# Patient Record
Sex: Female | Born: 1970 | Race: Black or African American | Hispanic: Yes | Marital: Married | Smoking: Never smoker
Health system: Southern US, Community
[De-identification: ages and names within clinical notes are randomized; demographics above are authoritative.]

## PROBLEM LIST (undated history)

## (undated) HISTORY — PX: NO PAST SURGERIES: SHX2092

---

## 2016-01-19 ENCOUNTER — Encounter (HOSPITAL_COMMUNITY): Payer: Self-pay

## 2016-01-19 ENCOUNTER — Emergency Department (HOSPITAL_COMMUNITY)
Admission: EM | Admit: 2016-01-19 | Discharge: 2016-01-20 | Disposition: A | Discharge status: Home / Self Care | Payer: No Typology Code available for payment source | Attending: Emergency Medicine | Admitting: Emergency Medicine

## 2016-01-19 ENCOUNTER — Emergency Department (HOSPITAL_COMMUNITY): Payer: No Typology Code available for payment source

## 2016-01-19 DIAGNOSIS — S161XXA Strain of muscle, fascia and tendon at neck level, initial encounter: Secondary | ICD-10-CM

## 2016-01-19 DIAGNOSIS — S199XXA Unspecified injury of neck, initial encounter: Secondary | ICD-10-CM | POA: Diagnosis present

## 2016-01-19 DIAGNOSIS — Y999 Unspecified external cause status: Secondary | ICD-10-CM | POA: Insufficient documentation

## 2016-01-19 DIAGNOSIS — Y9389 Activity, other specified: Secondary | ICD-10-CM | POA: Insufficient documentation

## 2016-01-19 DIAGNOSIS — Y92410 Unspecified street and highway as the place of occurrence of the external cause: Secondary | ICD-10-CM | POA: Insufficient documentation

## 2016-01-19 MED ORDER — IBUPROFEN 200 MG PO TABS
400.0000 mg | ORAL_TABLET | Freq: Once | ORAL | Status: AC
  Administered 2016-01-19: 400 mg via ORAL
  Filled 2016-01-19: qty 2

## 2016-01-19 MED ORDER — METHOCARBAMOL 500 MG PO TABS
1000.0000 mg | ORAL_TABLET | Freq: Once | ORAL | Status: AC
  Administered 2016-01-19: 1000 mg via ORAL
  Filled 2016-01-19: qty 2
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
## 2016-01-20 MED ORDER — METHOCARBAMOL 500 MG PO TABS: 1000.0000 mg | ORAL_TABLET | Freq: Four times a day (QID) | ORAL | 0 refills | Status: AC | PRN

## 2016-01-20 NOTE — Discharge Instructions (Signed)
For pain control you may take up to 800mg of Motrin (also known as ibuprofen). That is usually 4 over the counter pills,  3 times a day. Take with food to minimize stomach irritation  ° °You can also take  tylenol (acetaminophen) 975mg (this is 3 over the counter pills) four times a day. Do not drink alcohol or combine with other medications that have acetaminophen as an ingredient (Read the labels!).   ° °For breakthrough pain you may take Robaxin. Do not drink alcohol, drive or operate heavy machinery when taking Robaxin. ° °Do not hesitate to return to the emergency room for any new, worsening or concerning symptoms. ° °Please obtain primary care using resource guide below. Let them know that you were seen in the emergency room and that they will need to obtain records for further outpatient management. ° ° °
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
## 2020-03-28 ENCOUNTER — Encounter: Payer: Self-pay | Admitting: Internal Medicine

## 2020-03-28 ENCOUNTER — Ambulatory Visit: Payer: Self-pay | Attending: Internal Medicine | Admitting: Internal Medicine

## 2020-03-28 ENCOUNTER — Other Ambulatory Visit: Payer: Self-pay

## 2020-03-28 VITALS — BP 160/90 | HR 73 | Resp 16 | Ht 62.0 in | Wt 172.6 lb

## 2020-03-28 DIAGNOSIS — Z0001 Encounter for general adult medical examination with abnormal findings: Secondary | ICD-10-CM | POA: Insufficient documentation

## 2020-03-28 DIAGNOSIS — Z1231 Encounter for screening mammogram for malignant neoplasm of breast: Secondary | ICD-10-CM

## 2020-03-28 DIAGNOSIS — E66811 Obesity, class 1: Secondary | ICD-10-CM

## 2020-03-28 DIAGNOSIS — Z6831 Body mass index (BMI) 31.0-31.9, adult: Secondary | ICD-10-CM | POA: Insufficient documentation

## 2020-03-28 DIAGNOSIS — R079 Chest pain, unspecified: Secondary | ICD-10-CM

## 2020-03-28 DIAGNOSIS — Z2821 Immunization not carried out because of patient refusal: Secondary | ICD-10-CM | POA: Insufficient documentation

## 2020-03-28 DIAGNOSIS — K5901 Slow transit constipation: Secondary | ICD-10-CM | POA: Insufficient documentation

## 2020-03-28 DIAGNOSIS — Z7689 Persons encountering health services in other specified circumstances: Secondary | ICD-10-CM

## 2020-03-28 DIAGNOSIS — N644 Mastodynia: Secondary | ICD-10-CM

## 2020-03-28 DIAGNOSIS — R600 Localized edema: Secondary | ICD-10-CM | POA: Insufficient documentation

## 2020-03-28 DIAGNOSIS — R030 Elevated blood-pressure reading, without diagnosis of hypertension: Secondary | ICD-10-CM | POA: Insufficient documentation

## 2020-03-28 DIAGNOSIS — E669 Obesity, unspecified: Secondary | ICD-10-CM | POA: Insufficient documentation

## 2020-03-28 NOTE — Patient Instructions (Signed)
Please sign a release for [place] to get your medical records from your previous primary doctor.    Please fill out the forms for the orange card/cone discount card.  We have referred you to a cardiologist.  They will call you with that appointment.  Please follow-up in 1 week for repeat blood pressure check.  Try to limit salt in the foods as much as possible.

## 2020-03-28 NOTE — Progress Notes (Signed)
Patient ID: Janice Wang, female    DOB: 03/07/1971  MRN: [id number]  CC: New Patient (Initial Visit)   Subjective: Janice Wang is a 49 y.o. female who presents for new pt visit.  Patient declines interpreter stating that she speaks [place] but [place] is her native tongue. Her concerns today include:   Previous PCP was Dr. Gerome Sam in [facility].  She decided to change because she has moved to [facility] and this is closer for her.  Her mother is also my patient. Denies any chronic med conditions.  Not on rxn meds.   HM:  Last pap was 1 yr.  Last MMG was in 2019. Declines Flu and Tdapt.  Declines COVID vaccine  C/o pain in LT breast intermittently x 2 wks.   Sharp pain that lasts about 10 seconds. Can occur with rest or when she is moving around.  No relation to food Can radiate to upper back and down LT arm.  No numbness in arm.   No rash on breast or nipple changes No fhx of heart ds.  No family history of breast cancer.  Also c/o intermittent aching in stomach x >3 mths Sharp.  Last 1 min No burning. No worse with food.  No N/V/D.   Some wgh gained about 3 lbs Some constipation x 2 mths.  Has to drink milk to help move bowels.  Stools hard. No blood in stools.  Started eating prunes yesterday.  Blood pressure noted to be elevated today.  Patient denies any history of hypertension.  She feels her blood pressure was stopped because she just finished dropping her kids to school and had an argument with one of her children about his lack of performance in school  Past medical, surgical, family history and social history reviewed and updated. Patient Active Problem List   Diagnosis Date Noted  . Influenza vaccine refused 03/28/2020     Current Outpatient Medications on File Prior to Visit  Medication Sig Dispense Refill  . methocarbamol (ROBAXIN) 500 MG tablet Take 2 tablets (1,000 mg total) by mouth 4 (four) times daily as needed (Pain). (Patient not taking: Reported on  03/28/2020) 20 tablet 0   No current facility-administered medications on file prior to visit.    No Known Allergies  Social History   Socioeconomic History  . Marital status: Married    Spouse name: Not on file  . Number of children: 4  . Years of education: Not on file  . Highest education level: Not on file  Occupational History  . Not on file  Tobacco Use  . Smoking status: Never Smoker  . Smokeless tobacco: Never Used  Vaping Use  . Vaping Use: Never used  Substance and Sexual Activity  . Alcohol use: No  . Drug use: No  . Sexual activity: Not on file  Other Topics Concern  . Not on file  Social History Narrative  . Not on file   Social Determinants of Health   Financial Resource Strain:   . Difficulty of Paying Living Expenses: Not on file  Food Insecurity:   . Worried About Programme researcher, broadcasting/film/video in the Last Year: Not on file  . Ran Out of Food in the Last Year: Not on file  Transportation Needs:   . Lack of Transportation (Medical): Not on file  . Lack of Transportation (Non-Medical): Not on file  Physical Activity:   . Days of Exercise per Week: Not on file  . Minutes  of Exercise per Session: Not on file  Stress:   . Feeling of Stress : Not on file  Social Connections:   . Frequency of Communication with Friends and Family: Not on file  . Frequency of Social Gatherings with Friends and Family: Not on file  . Attends Religious Services: Not on file  . Active Member of Clubs or Organizations: Not on file  . Attends Banker Meetings: Not on file  . Marital Status: Not on file  Intimate Partner Violence:   . Fear of Current or Ex-Partner: Not on file  . Emotionally Abused: Not on file  . Physically Abused: Not on file  . Sexually Abused: Not on file    Family History  Problem Relation Age of Onset  . Diabetes Mother   . Hypertension Mother     Past Surgical History:  Procedure Laterality Date  . CESAREAN SECTION    . NO PAST  SURGERIES      ROS: Review of Systems Negative except as stated above  PHYSICAL EXAM: BP (!) 160/90   Pulse 73   Resp 16   Ht 5\' 2"  (1.575 m)   Wt 172 lb 9.6 oz (78.3 kg)   LMP 12/31/2015   SpO2 99%   BMI 31.57 kg/m   Physical Exam  General appearance - alert, well appearing, and in no distress Mental status - normal mood, behavior, speech, dress, motor activity, and thought processes Eyes - pupils equal and reactive, extraocular eye movements intact Nose - normal and patent, no erythema, discharge or polyps Mouth - mucous membranes moist, pharynx normal without lesions Neck - supple, no significant adenopathy Chest - clear to auscultation, no wheezes, rales or rhonchi, symmetric air entry Heart - normal rate, regular rhythm, normal S1, S2, no murmurs, rubs, clicks or gallops Breasts: CMA Pollock present.  Patient has fatty tissue in both axilla.  No axillary lymphadenopathy.  Breasts are pendulous.  No masses palpated. Abdomen - soft, nontender, nondistended, no masses or organomegaly Extremities - peripheral pulses normal, trace to 1+ bilateral lower extremity edema  EKG revealed sinus rhythm with what appears to be Q waves in V3 and T wave inversion in lead III.  ASSESSMENT AND PLAN: 1. Encounter to establish care  2. Breast pain, left Will refer for mammogram. - MM Digital Diagnostic Unilat L; Future  3. Chest pain in adult Pain is somewhat atypical in that it can occur at rest or with exertion.  Sounds to be more like breast pain however what is concerning is that the pain goes through to her upper back and also down the left arm.  Will refer to cardiology - EKG 12-Lead  4. Elevated blood pressure reading without diagnosis of hypertension DASH diet discussed and encouraged.  Given that both systolic and diastolic are elevated, I suspect that this is true hypertension.  I recommend starting HCTZ given that she also has some lower extremity edema on exam.  Patient  wants to hold off.  She prefers to come back in 1 week to have blood pressure rechecked by our clinical pharmacist.  If still elevated I would recommend starting HCTZ. - CBC - Comprehensive metabolic panel - Lipid panel  5. Slow transit constipation Discussed and encourage increase fiber in the diet light green leafy vegetables.  Recommend purchasing MiraLAX over-the-counter and using as needed  6. Obesity (BMI 30.0-34.9) We will discuss on next visit. Encourage healthy eating habits. - CBC - Comprehensive metabolic panel - Lipid panel  7.  Influenza vaccine refused Recommended.  Patient declined  8. Encounter for screening mammogram for malignant neoplasm of breast - MM Digital Diagnostic Unilat L; Future  Patient to sign a release for me to get her medical records from previous PCP.   Patient was given the opportunity to ask questions.  Patient verbalized understanding of the plan and was able to repeat key elements of the plan.   Orders Placed This Encounter  Procedures  . MM Digital Diagnostic Unilat L  . CBC  . Comprehensive metabolic panel  . Lipid panel  . EKG 12-Lead     Requested Prescriptions    No prescriptions requested or ordered in this encounter    Return in about 6 weeks (around 05/09/2020) for Give appt with [facility] in 1 wk for BP recheck.  Jonah Blue, MD, FACP

## 2020-03-29 ENCOUNTER — Telehealth: Payer: Self-pay

## 2020-03-29 LAB — CBC
Hematocrit: 42.5 % (ref 34.0–46.6)
Hemoglobin: 14.4 g/dL (ref 11.1–15.9)
MCH: 29.5 pg (ref 26.6–33.0)
MCHC: 33.9 g/dL (ref 31.5–35.7)
MCV: 87 fL (ref 79–97)
Platelets: 292 10*3/uL (ref 150–450)
RBC: 4.88 x10E6/uL (ref 3.77–5.28)
RDW: 12.8 % (ref 11.7–15.4)
WBC: 3.8 10*3/uL (ref 3.4–10.8)

## 2020-03-29 LAB — LIPID PANEL
Chol/HDL Ratio: 3.6 ratio (ref 0.0–4.4)
Cholesterol, Total: 265 mg/dL — ABNORMAL HIGH (ref 100–199)
HDL: 74 mg/dL (ref >39)
LDL Chol Calc (NIH): 176 mg/dL — ABNORMAL HIGH (ref 0–99)
Triglycerides: 90 mg/dL (ref 0–149)
VLDL Cholesterol Cal: 15 mg/dL (ref 5–40)

## 2020-03-29 LAB — COMPREHENSIVE METABOLIC PANEL
ALT: 10 IU/L (ref 0–32)
AST: 19 IU/L (ref 0–40)
Albumin/Globulin Ratio: 1.4 (ref 1.2–2.2)
Albumin: 4.2 g/dL (ref 3.8–4.8)
Alkaline Phosphatase: 78 IU/L (ref 44–121)
BUN/Creatinine Ratio: 11 (ref 9–23)
BUN: 9 mg/dL (ref 6–24)
Bilirubin Total: 0.4 mg/dL (ref 0.0–1.2)
CO2: 25 mmol/L (ref 20–29)
Calcium: 9.7 mg/dL (ref 8.7–10.2)
Chloride: 102 mmol/L (ref 96–106)
Creatinine, Ser: 0.81 mg/dL (ref 0.57–1.00)
GFR calc Af Amer: 100 mL/min/{1.73_m2} (ref >59)
GFR calc non Af Amer: 87 mL/min/{1.73_m2} (ref >59)
Globulin, Total: 3 g/dL (ref 1.5–4.5)
Glucose: 102 mg/dL — ABNORMAL HIGH (ref 65–99)
Potassium: 4.7 mmol/L (ref 3.5–5.2)
Sodium: 139 mmol/L (ref 134–144)
Total Protein: 7.2 g/dL (ref 6.0–8.5)

## 2020-03-29 NOTE — Telephone Encounter (Signed)
Contacted pt to go over lab results pt didn't answer lvm  

## 2020-03-29 NOTE — Progress Notes (Signed)
Let patient know that her blood cell counts are normal meaning that her red blood cell, white blood cell and platelet counts are normal.  Kidney and liver function tests are normal. Total and LDL cholesterol are elevated.  LDL cholesterol is 176 with goal being less than 100.  High cholesterol increases her risk for heart attack and strokes.  I recommend healthy eating habits and regular exercise as discussed on her recent visit.  We can recheck her cholesterol again in about 4 to 6 months to see if there is any improvement.  The 10-year ASCVD risk score Denman George DC Montez Hageman., et al., 2013) is: 6.7%   Values used to calculate the score:     Age: 45 years     Sex: Female     Is Non-Hispanic African American: Yes     Diabetic: Yes     Tobacco smoker: No     Systolic Blood Pressure: 160 mmHg     Is BP treated: No     HDL Cholesterol: 74 mg/dL     Total Cholesterol: 265 mg/dL

## 2020-03-30 NOTE — Telephone Encounter (Signed)
Returned call unable to reach , left VM to call back. Ok for the triage nurse to read MD results note when pt call back

## 2020-03-30 NOTE — Telephone Encounter (Signed)
Patient returned call for lab results no note in system for pec to give result please advise

## 2020-04-04 ENCOUNTER — Ambulatory Visit: Payer: Medicaid Other | Admitting: Pharmacist

## 2020-04-12 ENCOUNTER — Other Ambulatory Visit: Payer: Self-pay

## 2020-04-12 DIAGNOSIS — N644 Mastodynia: Secondary | ICD-10-CM

## 2020-04-20 ENCOUNTER — Ambulatory Visit: Payer: Self-pay | Attending: Internal Medicine | Admitting: Pharmacist

## 2020-04-20 ENCOUNTER — Other Ambulatory Visit: Payer: Self-pay

## 2020-04-20 ENCOUNTER — Other Ambulatory Visit: Payer: Self-pay | Admitting: Internal Medicine

## 2020-04-20 ENCOUNTER — Encounter: Payer: Self-pay | Admitting: Pharmacist

## 2020-04-20 VITALS — BP 142/93 | HR 64

## 2020-04-20 DIAGNOSIS — I10 Essential (primary) hypertension: Secondary | ICD-10-CM

## 2020-04-20 MED ORDER — HYDROCHLOROTHIAZIDE 25 MG PO TABS: 25.0000 mg | ORAL_TABLET | Freq: Every day | ORAL | 2 refills | Status: DC

## 2020-04-20 MED FILL — HYDROCHLOROTHIAZIDE 25 MG T: 25 | 30 days supply | Qty: 30 | Fill #0

## 2020-04-20 NOTE — Progress Notes (Signed)
   S:    PCP: Dr. Laural Benes   Patient arrives in good spirits. Presents to the clinic for hypertension evaluation, counseling, and management. Patient was referred and last seen by Primary Care Provider on 03/28/2020. BP was 160/90 at that visit. Pt without dx of hypertension prior to that visit, but Dr. Laural Benes suspected true hypertension and recommend HCTZ. Pt with LE edema on exam. Pt declined but was instructed to return today for recheck.     Medication adherence: does not currently take antihypertensive medications.  Current BP Medications include:  None   Antihypertensives tried in the past include: none   Dietary habits include: does not limit sodium; drinks coffee most mornings  Exercise habits include: none  Family / Social history:  - FHx: DM, HTN - Tobacco: never smoker - Alcohol: denies use    O:  Vitals:   04/20/20 0916  BP: (!) 142/93  Pulse: 64    Home BP readings: reports that her BP is always high when checked at home  Last 3 Office BP readings: BP Readings from Last 3 Encounters:  04/20/20 (!) 142/93  03/28/20 (!) 160/90  01/20/16 129/79    BMET    Component Value Date/Time   NA 139 03/28/2020 0942   K 4.7 03/28/2020 0942   CL 102 03/28/2020 0942   CO2 25 03/28/2020 0942   GLUCOSE 102 (H) 03/28/2020 0942   BUN 9 03/28/2020 0942   CREATININE 0.81 03/28/2020 0942   CALCIUM 9.7 03/28/2020 0942   GFRNONAA 87 03/28/2020 0942   GFRAA 100 03/28/2020 0942    Renal function: CrCl cannot be calculated (Patient's most recent lab result is older than the maximum 21 days allowed.).  Clinical ASCVD: No  The 10-year ASCVD risk score Denman George DC Jr., et al., 2013) is: 8%   Values used to calculate the score:     Age: 49 years     Sex: Female     Is Non-Hispanic African American: Yes     Diabetic: Yes     Tobacco smoker: No     Systolic Blood Pressure: 142 mmHg     Is BP treated: Yes     HDL Cholesterol: 74 mg/dL     Total Cholesterol: 265  mg/dL  A/P: Hypertension newly dx. BP Goal = < 130/80 mmHg. Pt is not currently taking medications.  -Started HCTZ 25 mg daily.  -Counseled on lifestyle modifications for blood pressure control including reduced dietary sodium, increased exercise, adequate sleep.  Results reviewed and written information provided.   Total time in face-to-face counseling 15 minutes.   F/U Clinic Visit in 1 month.   Butch Penny, PharmD, Patsy [place], CPP Clinical Pharmacist [facility] [phone]

## 2020-04-27 ENCOUNTER — Ambulatory Visit
Admission: RE | Admit: 2020-04-27 | Discharge: 2020-04-27 | Disposition: A | Discharge status: Home / Self Care | Payer: Medicaid Other | Source: Ambulatory Visit | Attending: Obstetrics and Gynecology | Admitting: Obstetrics and Gynecology

## 2020-04-27 ENCOUNTER — Ambulatory Visit: Payer: No Typology Code available for payment source | Admitting: *Deleted

## 2020-04-27 ENCOUNTER — Encounter (INDEPENDENT_AMBULATORY_CARE_PROVIDER_SITE_OTHER): Payer: Self-pay

## 2020-04-27 ENCOUNTER — Other Ambulatory Visit: Payer: Self-pay

## 2020-04-27 ENCOUNTER — Ambulatory Visit: Payer: Medicaid Other

## 2020-04-27 VITALS — BP 126/88 | Wt 169.5 lb

## 2020-04-27 DIAGNOSIS — N644 Mastodynia: Secondary | ICD-10-CM

## 2020-04-27 DIAGNOSIS — Z01419 Encounter for gynecological examination (general) (routine) without abnormal findings: Secondary | ICD-10-CM

## 2020-04-27 NOTE — Progress Notes (Signed)
Janice Wang is a 49 y.o. No obstetric history on file. female who presents to [facility] clinic today with complaint of left outer and upper breast pain x 4 months. Patient states the pain comes and goes with it being worse when touched. Patient rates the pain at a 10 out of 10.    Pap Smear: Pap smear completed today. Last Pap smear was 07/27/2015 at [facility] clinic and was normal. Per patient has no history of an abnormal Pap smear. Last Pap smear result is available in Epic.   Physical exam: Breasts Breasts symmetrical. No skin abnormalities bilateral breasts. No nipple retraction bilateral breasts. No nipple discharge bilateral breasts. No lymphadenopathy. No lumps palpated bilateral breasts. Complaints of left diffuse breast pain on exam.      Pelvic/Bimanual Ext Genitalia No lesions, no swelling and no discharge observed on external genitalia.        Vagina Vagina pink and normal texture. No lesions and scant amount of white discharge observed in vagina.        Cervix Cervix is present. Cervix pink and of normal texture. No discharge observed.    Uterus Uterus is present and palpable. Uterus in normal position and normal size.        Adnexae Bilateral ovaries present and palpable. No tenderness on palpation.         Rectovaginal No rectal exam completed today since patient had no rectal complaints. No skin abnormalities observed on exam.     Smoking History: Patient has never smoked.   Patient Navigation: Patient education provided. Access to services provided for patient through [facility]CP program.   Colorectal Cancer Screening: Per patient has never had colonoscopy completed. No complaints today.    Breast and Cervical Cancer Risk Assessment: Patient does not have family history of breast cancer, known genetic mutations, or radiation treatment to the chest before age 23. Patient does not have history of cervical dysplasia, immunocompromised, or DES exposure  in-utero.  Risk Assessment    Risk Scores      04/27/2020   Last edited by: Narda Rutherford, LPN   5-year risk: 1 %   Lifetime risk: 9 %          A: [facility]CP exam with pap smear Complaint of left breast pain.  P: Referred patient to the [facility] for a diagnostic mammogram. Appointment scheduled Thursday, April 27, 2020 at 1450.  Priscille Heidelberg, RN 04/27/2020 2:10 PM

## 2020-04-27 NOTE — Patient Instructions (Addendum)
Explained breast self awareness with [facility]. Pap smear completed today. Let her know [facility]CP will cover Pap smears and HPV typing every 5 years unless has a history of abnormal Pap smears. Referred patient to the [facility] for a diagnostic mammogram. Appointment scheduled Thursday, April 27, 2020 at 1450. Patient aware of appointment and will be there. Let patient know will follow up with her within the next couple weeks with results of Pap smear by letter or phone. [facility] verbalized understanding.  Tiwanna Tuch, Kathaleen Maser, RN 2:11 PM

## 2020-04-28 LAB — CYTOLOGY - PAP
Comment: NEGATIVE
Diagnosis: NEGATIVE
High risk HPV: NEGATIVE

## 2020-05-01 ENCOUNTER — Telehealth: Payer: Self-pay

## 2020-05-01 NOTE — Telephone Encounter (Addendum)
Patient informed negative pap/hpv results. Patient verbalized understanding.  Attempted to contact patient regarding negative Pap/HPV results. Left message on voicemail requesting return call.

## 2020-05-22 ENCOUNTER — Ambulatory Visit: Payer: Medicaid Other | Attending: Internal Medicine | Admitting: Pharmacist

## 2020-05-22 ENCOUNTER — Other Ambulatory Visit: Payer: Self-pay

## 2020-05-22 ENCOUNTER — Encounter: Payer: Self-pay | Admitting: Pharmacist

## 2020-05-22 VITALS — BP 145/115

## 2020-05-22 DIAGNOSIS — I10 Essential (primary) hypertension: Secondary | ICD-10-CM

## 2020-05-22 NOTE — Progress Notes (Signed)
   S:    PCP: Dr. Laural Benes   Patient arrives in good spirits. Presents to the clinic for hypertension evaluation, counseling, and management. Patient was referred and last seen by Primary Care Provider on 03/28/2020. I saw her last month and started HCTZ.   Today, pt denies chest pain, dyspnea. Denies HA, blurred vision, BLE edema.  Medication adherence: picked up her medication but did not start taking it   Current BP Medications include:  HCTZ 25 mg daily (not taking)  Antihypertensives tried in the past include: none   Dietary habits include: does not limit sodium; drinks coffee most mornings  Exercise habits include: none  Family / Social history:  - FHx: DM, HTN - Tobacco: never smoker - Alcohol: denies use    O:  Vitals:   05/22/20 0902  BP: (!) 145/115   Home BP readings: reports that her BP is always high when checked at home  Last 3 Office BP readings: BP Readings from Last 3 Encounters:  05/22/20 (!) 145/115  04/27/20 126/88  04/20/20 (!) 142/93    BMET    Component Value Date/Time   NA 139 03/28/2020 0942   K 4.7 03/28/2020 0942   CL 102 03/28/2020 0942   CO2 25 03/28/2020 0942   GLUCOSE 102 (H) 03/28/2020 0942   BUN 9 03/28/2020 0942   CREATININE 0.81 03/28/2020 0942   CALCIUM 9.7 03/28/2020 0942   GFRNONAA 87 03/28/2020 0942   GFRAA 100 03/28/2020 0942    Renal function: CrCl cannot be calculated (Patient's most recent lab result is older than the maximum 21 days allowed.).  Clinical ASCVD: No  The 10-year ASCVD risk score Denman George DC Jr., et al., 2013) is: 8.8%   Values used to calculate the score:     Age: 50 years     Sex: Female     Is Non-Hispanic African American: Yes     Diabetic: Yes     Tobacco smoker: No     Systolic Blood Pressure: 145 mmHg     Is BP treated: Yes     HDL Cholesterol: 74 mg/dL     Total Cholesterol: 265 mg/dL  A/P: Hypertension newly dx. BP Goal = < 130/80 mmHg. Pt is not currently taking her HCTZ. After today's  discussion, she verbalizes her intent to start HCTZ when she returns home.  She has an appointment with Dr. Laural Benes later this week.  -Started HCTZ 25 mg daily.  -Counseled on lifestyle modifications for blood pressure control including reduced dietary sodium, increased exercise, adequate sleep.  Results reviewed and written information provided.   Total time in face-to-face counseling 15 minutes.   F/U Clinic Visit 05/25/2020 with PCP.   Butch Penny, PharmD, Patsy Baltimore, CPP Clinical Pharmacist [facility] [phone]

## 2020-05-25 ENCOUNTER — Ambulatory Visit: Payer: Medicaid Other | Admitting: Internal Medicine

## 2020-06-05 NOTE — Progress Notes (Unsigned)
   S:     PCP: Dr. Laural Benes  Patient arrives in good spirits. Presents to the clinic for hypertension evaluation, counseling, and management.  Patient was referred and last seen by Primary Care Provider on 03/28/2020. Pt last seen by pharmacy on 05/22/20 with elevated BP of 145/115 and did not start HCTZ yet.   Today, patient reports ***  Labs = 03/2020 - wnl  Compliance? Took meds this morning? When do you take your meds? Dizziness, headaches, blurred vision? History of swelling? Check Clinic BP? Home BP logs? If no logs, bring to next visit w/ BP cuff Go over BP goals Additional BP therapy if needed .  Diet??  Exercise??    Medication adherence *** .  Current BP Medications include:  HCTZ 25 mg daily   Antihypertensives tried in the past include: none  Dietary habits include: does not limit sodium; drinks coffee most mornings  Exercise habits include: none  Family / Social history:  - FHx: DM, HTN - Tobacco: never smoker - Alcohol: denies use    O:   Home BP readings: ***  Last 3 Office BP readings: BP Readings from Last 3 Encounters:  05/22/20 (!) 145/115  04/27/20 126/88  04/20/20 (!) 142/93    BMET    Component Value Date/Time   NA 139 03/28/2020 0942   K 4.7 03/28/2020 0942   CL 102 03/28/2020 0942   CO2 25 03/28/2020 0942   GLUCOSE 102 (H) 03/28/2020 0942   BUN 9 03/28/2020 0942   CREATININE 0.81 03/28/2020 0942   CALCIUM 9.7 03/28/2020 0942   GFRNONAA 87 03/28/2020 0942   GFRAA 100 03/28/2020 0942    Renal function: CrCl cannot be calculated (Patient's most recent lab result is older than the maximum 21 days allowed.).  Clinical ASCVD: No  The 10-year ASCVD risk score Denman George DC Jr., et al., 2013) is: 8.8%   Values used to calculate the score:     Age: 50 years     Sex: Female     Is Non-Hispanic African American: Yes     Diabetic: Yes     Tobacco smoker: No     Systolic Blood Pressure: 145 mmHg     Is BP treated: Yes     HDL  Cholesterol: 74 mg/dL     Total Cholesterol: 265 mg/dL  A/P: Hypertension diagnosed *** currently *** on current medications. BP Goal = < *** mmHg. Medication adherence ***.  -{Meds adjust:18428} ***.  -F/u labs ordered - *** -Counseled on lifestyle modifications for blood pressure control including reduced dietary sodium, increased exercise, adequate sleep.  Results reviewed and written information provided.   Total time in face-to-face counseling *** minutes.   F/U Clinic Visit in ***.  Patient seen with ***  Fabio Neighbors, PharmD, BCPS PGY2 Ambulatory Care Resident [facility]  [facility]armacy

## 2020-06-07 ENCOUNTER — Ambulatory Visit: Payer: No Typology Code available for payment source | Admitting: Pharmacist

## 2020-06-16 ENCOUNTER — Ambulatory Visit: Payer: Medicaid Other | Attending: Internal Medicine | Admitting: Internal Medicine

## 2020-06-16 ENCOUNTER — Other Ambulatory Visit: Payer: Self-pay

## 2020-06-16 ENCOUNTER — Encounter: Payer: Self-pay | Admitting: Internal Medicine

## 2020-06-16 VITALS — BP 141/97 | HR 76 | Temp 98.1°F | Resp 16 | Ht 63.0 in | Wt 172.2 lb

## 2020-06-16 DIAGNOSIS — Z2821 Immunization not carried out because of patient refusal: Secondary | ICD-10-CM

## 2020-06-16 DIAGNOSIS — Z114 Encounter for screening for human immunodeficiency virus [HIV]: Secondary | ICD-10-CM

## 2020-06-16 DIAGNOSIS — Z1211 Encounter for screening for malignant neoplasm of colon: Secondary | ICD-10-CM

## 2020-06-16 DIAGNOSIS — Z1159 Encounter for screening for other viral diseases: Secondary | ICD-10-CM

## 2020-06-16 DIAGNOSIS — I10 Essential (primary) hypertension: Secondary | ICD-10-CM

## 2020-06-16 DIAGNOSIS — K5901 Slow transit constipation: Secondary | ICD-10-CM

## 2020-06-16 NOTE — Progress Notes (Signed)
Patient ID: Janice Wang, female    DOB: 1970/07/24  MRN: [id number]  CC: Follow-up on blood pressure  Subjective: Janice Wang is a 50 y.o. female who presents for follow-up on blood pressure.  We will also schedule to do her physical today but patient wanted to have that rescheduled. Her concerns today include:  Patient with history of obesity, constipation,  Did see Lurena Joiner in f/u x 2 since last visit with me for recheck on blood pressure.  Blood pressure had remained elevated so we recommended starting hydrochlorothiazide 25 mg daily.  Patient did not start the medication.  She states she wants to try some natural products like beets and some type of ginger supplement.  She has been checking her blood pressure at home and states that it is coming down. BP this a.m at home 126/98.   Limits salt and has cut out coffee. Exercise every morning for 20 mins.  Constipation better with Miralax  HM Did get COVID vaccine as yet.  She has not made up her mind. Due for colon CA screening.  On last visit she had told me that she had a Pap smear about a year ago.  However I was able to access her records through care everywhere.  Last Pap smear was in 2017.  She is agreeable for screening for hepatitis C and HIV. Patient Active Problem List   Diagnosis Date Noted  . Influenza vaccine refused 03/28/2020  . Elevated blood pressure reading without diagnosis of hypertension 03/28/2020  . Slow transit constipation 03/28/2020  . Obesity (BMI 30.0-34.9) 03/28/2020     Current Outpatient Medications on File Prior to Visit  Medication Sig Dispense Refill  . hydrochlorothiazide (HYDRODIURIL) 25 MG tablet Take 1 tablet (25 mg total) by mouth daily. 30 tablet 2  . methocarbamol (ROBAXIN) 500 MG tablet Take 2 tablets (1,000 mg total) by mouth 4 (four) times daily as needed (Pain). (Patient not taking: Reported on 03/28/2020) 20 tablet 0   No current facility-administered medications on file prior to  visit.    No Known Allergies  Social History   Socioeconomic History  . Marital status: Married    Spouse name: Not on file  . Number of children: 3  . Years of education: Not on file  . Highest education level: High school graduate  Occupational History  . Not on file  Tobacco Use  . Smoking status: Never Smoker  . Smokeless tobacco: Never Used  Vaping Use  . Vaping Use: Never used  Substance and Sexual Activity  . Alcohol use: No  . Drug use: No  . Sexual activity: Yes    Birth control/protection: Post-menopausal  Other Topics Concern  . Not on file  Social History Narrative  . Not on file   Social Determinants of Health   Financial Resource Strain: Not on file  Food Insecurity: Not on file  Transportation Needs: No Transportation Needs  . Lack of Transportation (Medical): No  . Lack of Transportation (Non-Medical): No  Physical Activity: Not on file  Stress: Not on file  Social Connections: Not on file  Intimate Partner Violence: Not on file    Family History  Problem Relation Age of Onset  . Diabetes Mother   . Hypertension Mother     Past Surgical History:  Procedure Laterality Date  . CESAREAN SECTION    . NO PAST SURGERIES      ROS: Review of Systems Negative except as stated above  PHYSICAL EXAM:  BP (!) 141/97   Pulse 76   Temp 98.1 F (36.7 C)   Resp 16   Ht _0  (1.6 m)   Wt 172 lb 3.2 oz (78.1 kg)   LMP 12/31/2015   SpO2 99%   BMI 30.50 kg/m   Physical Exam  General appearance - alert, well appearing, and in no distress Chest - clear to auscultation, no wheezes, rales or rhonchi, symmetric air entry Heart - normal rate, regular rhythm, normal S1, S2, no murmurs, rubs, clicks or gallops Extremities - peripheral pulses normal, no pedal edema, no clubbing or cyanosis  CMP Latest Ref Rng & Units 03/28/2020  Glucose 65 - 99 mg/dL 102(H)  BUN 6 - 24 mg/dL 9  Creatinine 0.57 - 1.00 mg/dL 0.81  Sodium 134 - 144 mmol/L 139   Potassium 3.5 - 5.2 mmol/L 4.7  Chloride 96 - 106 mmol/L 102  CO2 20 - 29 mmol/L 25  Calcium 8.7 - 10.2 mg/dL 9.7  Total Protein 6.0 - 8.5 g/dL 7.2  Total Bilirubin 0.0 - 1.2 mg/dL 0.4  Alkaline Phos 44 - 121 IU/L 78  AST 0 - 40 IU/L 19  ALT 0 - 32 IU/L 10   Lipid Panel     Component Value Date/Time   CHOL 265 (H) 03/28/2020 0942   TRIG 90 03/28/2020 0942   HDL 74 03/28/2020 0942   CHOLHDL 3.6 03/28/2020 0942   LDLCALC 176 (H) 03/28/2020 0942    CBC    Component Value Date/Time   WBC 3.8 03/28/2020 0942   RBC 4.88 03/28/2020 0942   HGB 14.4 03/28/2020 0942   HCT 42.5 03/28/2020 0942   PLT 292 03/28/2020 0942   MCV 87 03/28/2020 0942   MCH 29.5 03/28/2020 0942   MCHC 33.9 03/28/2020 0942   RDW 12.8 03/28/2020 0942    ASSESSMENT AND PLAN: 1. Essential hypertension Blood pressure not at goal.  Inform patient that normal blood pressure is 120/80 or lower.  Advised that I doubt that natural supplements that she wants to try will make a difference in lowering her blood pressure.  Discussed health risks associated with uncontrolled blood pressure.  Patient still reluctant to start the hydrochlorothiazide.  2. Slow transit constipation Continue MiraLAX as needed.  3. COVID-19 vaccination declined Strongly advise getting the vaccine but patient declined.  4. Screening for colon cancer Discussed colon cancer screening methods.  She is willing to do the fit test.  Kit given today. - Fecal occult blood, imunochemical([facility]/Sunquest)  5. Screening for HIV (human immunodeficiency virus) - HIV Antibody (routine testing w rflx); Future  6. Need for hepatitis C screening test - Hepatitis C Antibody; Future   Patient was given the opportunity to ask questions.  Patient verbalized understanding of the plan and was able to repeat key elements of the plan.   Orders Placed This Encounter  Procedures  . Fecal occult blood, imunochemical([facility]/Sunquest)  . HIV Antibody  (routine testing w rflx)  . Hepatitis C Antibody     Requested Prescriptions    No prescriptions requested or ordered in this encounter    Return in about 1 month (around 07/14/2020) for for PAP.Marland Kitchen  Karle Plumber, MD, FACP

## 2020-06-16 NOTE — Patient Instructions (Signed)
Your blood pressure is elevated.  Goal is 130/80 or lower.  Please consider starting the blood pressure medication that we prescribed for you called hydrochlorothiazide.  Please consider getting the COVID-19 vaccination.  You can call [phone] to schedule an appointment to get the COVID-19 vaccine.

## 2020-06-21 ENCOUNTER — Telehealth: Payer: Self-pay

## 2020-06-21 LAB — FECAL OCCULT BLOOD, IMMUNOCHEMICAL: Fecal Occult Bld: NEGATIVE

## 2020-06-21 NOTE — Telephone Encounter (Signed)
Contacted pt to go over lab results pt didn't answer lvm  

## 2020-08-18 ENCOUNTER — Ambulatory Visit: Payer: No Typology Code available for payment source | Admitting: Internal Medicine

## 2021-10-01 ENCOUNTER — Encounter (HOSPITAL_COMMUNITY): Payer: Self-pay | Admitting: Emergency Medicine

## 2021-10-01 ENCOUNTER — Ambulatory Visit (HOSPITAL_COMMUNITY)
Admission: EM | Admit: 2021-10-01 | Discharge: 2021-10-01 | Disposition: A | Discharge status: Home / Self Care | Payer: BLUE CROSS/BLUE SHIELD | Attending: Physician Assistant | Admitting: Physician Assistant

## 2021-10-01 DIAGNOSIS — R350 Frequency of micturition: Secondary | ICD-10-CM | POA: Diagnosis not present

## 2021-10-01 DIAGNOSIS — S39012A Strain of muscle, fascia and tendon of lower back, initial encounter: Secondary | ICD-10-CM | POA: Diagnosis not present

## 2021-10-01 DIAGNOSIS — M545 Low back pain, unspecified: Secondary | ICD-10-CM | POA: Insufficient documentation

## 2021-10-01 LAB — POCT URINALYSIS DIPSTICK, ED / UC
Bilirubin Urine: NEGATIVE
Glucose, UA: NEGATIVE mg/dL
Hgb urine dipstick: NEGATIVE
Ketones, ur: NEGATIVE mg/dL
Nitrite: NEGATIVE
Protein, ur: NEGATIVE mg/dL
Specific Gravity, Urine: <=1.005 (ref 1.005–1.030)
Urobilinogen, UA: 0.2 mg/dL (ref 0.0–1.0)
pH: 7 (ref 5.0–8.0)

## 2021-10-01 MED ORDER — IBUPROFEN 600 MG PO TABS: 600.0000 mg | ORAL_TABLET | Freq: Three times a day (TID) | ORAL | 0 refills | Status: AC

## 2021-10-01 NOTE — ED Triage Notes (Signed)
Pt reports lower back pain x 2 weeks. States she went to the chiropractor and it did not help  Also reports urinary frequency for 2/3 months. States she wakes up very hour to use the restroom.
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
## 2021-10-01 NOTE — Discharge Instructions (Addendum)
Advised to follow-up with PCP for evaluation of the back pain and frequency if symptoms fail to improve within the next 10 to 14 days. Advised to use ibuprofen 600 mg 1 every 8 hours with food to help relieve the back pain over the next week.

## 2021-10-02 LAB — URINE CULTURE: Culture: NO GROWTH

## 2021-12-27 ENCOUNTER — Ambulatory Visit: Payer: Self-pay | Admitting: *Deleted

## 2021-12-27 NOTE — Telephone Encounter (Addendum)
Cut cut off from interpreter during call. Computer had to be restarted, already left a message for pt to return call.

## 2021-12-27 NOTE — Telephone Encounter (Signed)
Summary: Vaginal Icthing for 2 weeks, no appt   Pt is experiencing vaginal itch for 2 weeks, no appt soon enough. Seeking advice      Via [place] interpreter pt called. Voicemail left for pt with call back number.

## 2021-12-28 ENCOUNTER — Telehealth: Payer: No Typology Code available for payment source | Admitting: Family Medicine

## 2021-12-28 ENCOUNTER — Ambulatory Visit: Payer: Self-pay | Admitting: *Deleted

## 2021-12-28 NOTE — Telephone Encounter (Signed)
Noted patient has appointment  

## 2021-12-28 NOTE — Telephone Encounter (Signed)
Noted.-----DD,RMA 

## 2021-12-28 NOTE — Telephone Encounter (Signed)
Routing to CMA 

## 2021-12-28 NOTE — Telephone Encounter (Signed)
  Chief Complaint: Vaginal Itching Symptoms: Severe itching, redness, swelling Frequency: x 2 weeks Pertinent Negatives: Patient denies denies discharge, fever, rash Disposition: [] ED /[] Urgent Care (no appt availability in office) / [] Appointment(In office/virtual)/ [x]  [facility]/ [] Home Care/ [] Refused Recommended Disposition /[] [facility]/ []  Follow-up with PCP Additional Notes: No availability, [facility] secured for pt.  Reason for Disposition  MODERATE-SEVERE itching (i.e., interferes with school, work, or sleep)  Answer Assessment - Initial Assessment Questions 1. SYMPTOM: "What's the main symptom you're concerned about?" (e.g., pain, itching, dryness)     Itching 2. LOCATION: "Where is the  *No Answer* located?" (e.g., inside/outside, left/right)     Labia, vaginal 3. ONSET: "When did the    start?"     2 weeks ago 4. PAIN: "Is there any pain?" If Yes, ask: "How bad is it?" (Scale: 1-10; mild, moderate, severe)   -  MILD (1-3): Doesn't interfere with normal activities.    -  MODERATE (4-7): Interferes with normal activities (e.g., work or school) or awakens from sleep.     -  SEVERE (8-10): Excruciating pain, unable to do any normal activities.     Tender 5. ITCHING: "Is there any itching?" If Yes, ask: "How bad is it?" (Scale: 1-10; mild, moderate, severe)     Severe 6. CAUSE: "What do you think is causing the discharge?" "Have you had the same problem before? What happened then?"     No discharge 7. OTHER SYMPTOMS: "Do you have any other symptoms?" (e.g., fever, itching, vaginal bleeding, pain with urination, injury to genital area, vaginal foreign body)     Swelling, redness, itching  Protocols used: Vaginal Symptoms-A-AH

## 2021-12-28 NOTE — Telephone Encounter (Signed)
Pt called back, triaged. [facility] appt secured for pt.

## 2021-12-28 NOTE — Progress Notes (Signed)
The patient no-showed for appointment despite this provider sending direct link, reaching out via phone with no response and waiting for at least 10 minutes from appointment time for patient to join. They will be marked as a NS for this appointment/time.   Corneshia Hines M Aristide Waggle, NP    

## 2022-02-05 ENCOUNTER — Emergency Department (HOSPITAL_COMMUNITY): Payer: No Typology Code available for payment source

## 2022-02-05 ENCOUNTER — Other Ambulatory Visit: Payer: Self-pay | Admitting: Internal Medicine

## 2022-02-05 ENCOUNTER — Emergency Department (HOSPITAL_COMMUNITY)
Admission: EM | Admit: 2022-02-05 | Discharge: 2022-02-05 | Disposition: A | Discharge status: Home / Self Care | Payer: No Typology Code available for payment source | Attending: Emergency Medicine | Admitting: Emergency Medicine

## 2022-02-05 ENCOUNTER — Other Ambulatory Visit: Payer: Self-pay

## 2022-02-05 DIAGNOSIS — R197 Diarrhea, unspecified: Secondary | ICD-10-CM | POA: Insufficient documentation

## 2022-02-05 DIAGNOSIS — Z79899 Other long term (current) drug therapy: Secondary | ICD-10-CM | POA: Insufficient documentation

## 2022-02-05 DIAGNOSIS — I10 Essential (primary) hypertension: Secondary | ICD-10-CM | POA: Insufficient documentation

## 2022-02-05 DIAGNOSIS — R1084 Generalized abdominal pain: Secondary | ICD-10-CM | POA: Insufficient documentation

## 2022-02-05 DIAGNOSIS — M549 Dorsalgia, unspecified: Secondary | ICD-10-CM | POA: Diagnosis not present

## 2022-02-05 DIAGNOSIS — R112 Nausea with vomiting, unspecified: Secondary | ICD-10-CM | POA: Insufficient documentation

## 2022-02-05 DIAGNOSIS — R0789 Other chest pain: Secondary | ICD-10-CM | POA: Diagnosis not present

## 2022-02-05 LAB — BASIC METABOLIC PANEL
Anion gap: 8 (ref 5–15)
BUN: 9 mg/dL (ref 6–20)
CO2: 23 mmol/L (ref 22–32)
Calcium: 8.9 mg/dL (ref 8.9–10.3)
Chloride: 106 mmol/L (ref 98–111)
Creatinine, Ser: 0.7 mg/dL (ref 0.44–1.00)
GFR, Estimated: >60 mL/min (ref >60)
Glucose, Bld: 118 mg/dL — ABNORMAL HIGH (ref 70–99)
Potassium: 3.9 mmol/L (ref 3.5–5.1)
Sodium: 137 mmol/L (ref 135–145)

## 2022-02-05 LAB — HEPATIC FUNCTION PANEL
ALT: 16 U/L (ref 0–44)
AST: 23 U/L (ref 15–41)
Albumin: 3.8 g/dL (ref 3.5–5.0)
Alkaline Phosphatase: 60 U/L (ref 38–126)
Bilirubin, Direct: <0.1 mg/dL (ref 0.0–0.2)
Total Bilirubin: 0.8 mg/dL (ref 0.3–1.2)
Total Protein: 7.9 g/dL (ref 6.5–8.1)

## 2022-02-05 LAB — CBC
HCT: 39.9 % (ref 36.0–46.0)
Hemoglobin: 13.9 g/dL (ref 12.0–15.0)
MCH: 31 pg (ref 26.0–34.0)
MCHC: 34.8 g/dL (ref 30.0–36.0)
MCV: 89.1 fL (ref 80.0–100.0)
Platelets: 266 10*3/uL (ref 150–400)
RBC: 4.48 MIL/uL (ref 3.87–5.11)
RDW: 12.6 % (ref 11.5–15.5)
WBC: 4.3 10*3/uL (ref 4.0–10.5)
nRBC: 0 % (ref 0.0–0.2)

## 2022-02-05 LAB — TROPONIN I (HIGH SENSITIVITY)
Troponin I (High Sensitivity): 4 ng/L (ref <18)
Troponin I (High Sensitivity): 3 ng/L (ref <18)

## 2022-02-05 LAB — D-DIMER, QUANTITATIVE: D-Dimer, Quant: <0.27 ug/mL-FEU (ref 0.00–0.50)

## 2022-02-05 LAB — LIPASE, BLOOD: Lipase: 29 U/L (ref 11–51)

## 2022-02-05 MED ORDER — HYDROCHLOROTHIAZIDE 25 MG PO TABS
25.0000 mg | ORAL_TABLET | Freq: Every day | ORAL | Status: DC
  Administered 2022-02-05: 25 mg via ORAL
  Filled 2022-02-05: qty 1

## 2022-02-05 MED ORDER — PANTOPRAZOLE SODIUM 40 MG IV SOLR
40.0000 mg | Freq: Once | INTRAVENOUS | Status: AC
  Administered 2022-02-05: 40 mg via INTRAVENOUS
  Filled 2022-02-05: qty 10

## 2022-02-05 MED ORDER — FENTANYL CITRATE PF 50 MCG/ML IJ SOSY
50.0000 ug | PREFILLED_SYRINGE | INTRAMUSCULAR | Status: DC | PRN
  Administered 2022-02-05: 50 ug via INTRAVENOUS
  Filled 2022-02-05: qty 1

## 2022-02-05 MED ORDER — FAMOTIDINE 20 MG PO TABS: 20.0000 mg | ORAL_TABLET | Freq: Two times a day (BID) | ORAL | 0 refills | Status: AC

## 2022-02-05 MED ORDER — HYDROCHLOROTHIAZIDE 25 MG PO TABS: ORAL_TABLET | Freq: Every day | ORAL | 0 refills | Status: AC

## 2022-02-05 MED ORDER — DICYCLOMINE HCL 20 MG PO TABS: 20.0000 mg | ORAL_TABLET | Freq: Two times a day (BID) | ORAL | 0 refills | Status: AC

## 2022-02-05 MED ORDER — ONDANSETRON 4 MG PO TBDP: 4.0000 mg | ORAL_TABLET | Freq: Three times a day (TID) | ORAL | 0 refills | Status: AC | PRN

## 2022-02-05 NOTE — ED Notes (Signed)
DC instructions reviewed with pt. PT verbalized understanding. PT DC °

## 2022-02-05 NOTE — Discharge Instructions (Addendum)
Your work-up today was quite reassuring.  I suspect that some your symptoms are related to some spasming of your intestines given that you have had some nausea and some diarrhea and some vomiting.  I would like you to use Zofran as needed for nausea which I prescribed you also Pepcid which she will take twice daily and Bentyl as needed for pain up to twice daily.  Make sure you are drinking plenty of water, eat bland foods, please take the HCTZ which is a blood pressure medicine as prescribed and follow-up with your primary care doctor.   Please return immediately to the emergency room for any new or concerning symptoms.  I have also given you the information for a cardiologist to follow up with

## 2022-02-05 NOTE — ED Triage Notes (Addendum)
Pt. Stated, Ive had stomach, chest and going to my back pain since Sunday. Given yesterday 2 ASA

## 2022-02-05 NOTE — Telephone Encounter (Signed)
Medication Refill - Medication: hydrochlorothiazide (HYDRODIURIL) 25 MG tablet  Has the patient contacted their pharmacy? No.   Preferred Pharmacy (with phone number or street name):  [place] Phone:  [phone]  Fax:  [phone]     Has the patient been seen for an appointment in the last year OR does the patient have an upcoming appointment? No.  Agent: Please be advised that RX refills may take up to 3 business days. We ask that you follow-up with your pharmacy.

## 2022-02-05 NOTE — Telephone Encounter (Signed)
Requested medication (s) are due for refill today: no  Requested medication (s) are on the active medication list: prescription ended 04/20/21  Last refill:  04/20/20 30 2  RF  Future visit scheduled: no  Notes to clinic:  could not find where med was refilled past the end date.    Requested Prescriptions  Pending Prescriptions Disp Refills   hydrochlorothiazide (HYDRODIURIL) 25 MG tablet 30 tablet 2    Sig: TAKE 1 TABLET (25 MG TOTAL) BY MOUTH DAILY.     Cardiovascular: Diuretics - Thiazide Failed - 02/05/2022 11:31 AM      Failed - Last BP in normal range    BP Readings from Last 1 Encounters:  02/05/22 (!) 149/88         Failed - Valid encounter within last 6 months    Recent Outpatient Visits           1 year ago Essential hypertension   [place], Deborah B, MD   1 year ago Essential hypertension   [place], Stephen L, RPH-CPP   1 year ago Essential hypertension   [place], RPH-CPP   1 year ago Encounter to establish care   [place] Ladell Pier, MD              Passed - Cr in normal range and within 180 days    Creatinine, Ser  Date Value Ref Range Status  02/05/2022 0.70 0.44 - 1.00 mg/dL Final         Passed - K in normal range and within 180 days    Potassium  Date Value Ref Range Status  02/05/2022 3.9 3.5 - 5.1 mmol/L Final         Passed - Na in normal range and within 180 days    Sodium  Date Value Ref Range Status  02/05/2022 137 135 - 145 mmol/L Final  03/28/2020 139 134 - 144 mmol/L Final

## 2022-02-05 NOTE — ED Provider Notes (Signed)
[facility] EMERGENCY DEPARTMENT Provider Note   CSN: [id number] Arrival date & time: 02/05/22  7619     History  Chief Complaint  Patient presents with   Chest Pain   Back Pain    Janice Wang is a 51 y.o. female.   Chest Pain Associated symptoms: back pain   Back Pain Associated symptoms: chest pain   Patient is a 50 year old female to the emergency room today with complaints of nausea vomiting and diarrhea abdominal pain and chest pain for the past 3 days.  She states that she has not had any emesis today.  She states that she had more than 4 episodes of watery diarrhea 3 days ago but has had simply loose stool since.  She states she has some achy back pain as well.  She denies any difficulty breathing she denies any tearing or ripping quality to her chest pain.  She states that the pain has been much improved since she is arrived in the emergency room.  She has not tried any medications prior to arrival other than aspirin which did not provide any relief.  She denies any nausea currently and states that she has not vomited today.  She denies any numbness or weakness in any extremity.  She denies any cough, hemoptysis.  She does state that she feels somewhat fatigued.  No other associate symptoms.    Home Medications Prior to Admission medications   Medication Sig Start Date End Date Taking? Authorizing Provider  dicyclomine (BENTYL) 20 MG tablet Take 1 tablet (20 mg total) by mouth 2 (two) times daily. 02/05/22  Yes Chaise Passarella S, PA  famotidine (PEPCID) 20 MG tablet Take 1 tablet (20 mg total) by mouth 2 (two) times daily. 02/05/22  Yes Myran Arcia S, PA  ondansetron (ZOFRAN-ODT) 4 MG disintegrating tablet Take 1 tablet (4 mg total) by mouth every 8 (eight) hours as needed for nausea or vomiting. 02/05/22  Yes [place] Depaz S, PA  hydrochlorothiazide (HYDRODIURIL) 25 MG tablet TAKE 1 TABLET (25 MG TOTAL) BY MOUTH DAILY. 02/05/22 02/05/23  Tedd Sias, PA  ibuprofen (ADVIL) 600 MG tablet Take 1 tablet (600 mg total) by mouth 3 (three) times daily. 10/01/21   Nyoka Lint, PA-C  methocarbamol (ROBAXIN) 500 MG tablet Take 2 tablets (1,000 mg total) by mouth 4 (four) times daily as needed (Pain). Patient not taking: Reported on 03/28/2020 01/20/16   Pisciotta, Elmyra Ricks, PA-C      Allergies    Patient has no known allergies.    Review of Systems   Review of Systems  Cardiovascular:  Positive for chest pain.  Musculoskeletal:  Positive for back pain.    Physical Exam Updated Vital Signs BP (!) 145/85   Pulse 67   Temp 98 F (36.7 C) (Oral)   Resp 14   Ht 5\' 6"  (1.676 m)   Wt 77.1 kg   LMP 12/31/2015   SpO2 100%   BMI 27.44 kg/m  Physical Exam Vitals and nursing note reviewed.  Constitutional:      General: She is not in acute distress.    Comments: Pleasant well-appearing 51 year old.  In no acute distress.  Sitting comfortably in bed.  Able answer questions appropriately follow commands. No increased work of breathing. Speaking in full sentences.   HENT:     Head: Normocephalic and atraumatic.     Nose: Nose normal.     Mouth/Throat:     Mouth: Mucous membranes are moist.  Eyes:  General: No scleral icterus. Cardiovascular:     Rate and Rhythm: Normal rate and regular rhythm.     Pulses: Normal pulses.     Heart sounds: Normal heart sounds.     Comments: Bilateral radial artery and dorsalis pedis pulses 3+ and symmetric. Pulmonary:     Effort: Pulmonary effort is normal. No respiratory distress.     Breath sounds: No wheezing.  Abdominal:     Palpations: Abdomen is soft.     Tenderness: There is no abdominal tenderness. There is no guarding or rebound.  Musculoskeletal:     Cervical back: Normal range of motion.     Right lower leg: No edema.     Left lower leg: No edema.     Comments: No lower extremity edema or calf tenderness  Skin:    General: Skin is warm and dry.     Capillary Refill: Capillary  refill takes less than 2 seconds.  Neurological:     Mental Status: She is alert. Mental status is at baseline.  Psychiatric:        Mood and Affect: Mood normal.        Behavior: Behavior normal.     ED Results / Procedures / Treatments   Labs (all labs ordered are listed, but only abnormal results are displayed) Labs Reviewed  BASIC METABOLIC PANEL - Abnormal; Notable for the following components:      Result Value   Glucose, Bld 118 (*)    All other components within normal limits  CBC  HEPATIC FUNCTION PANEL  LIPASE, BLOOD  D-DIMER, QUANTITATIVE  TROPONIN I (HIGH SENSITIVITY)  TROPONIN I (HIGH SENSITIVITY)    EKG EKG Interpretation  Date/Time:  Tuesday February 05 2022 07:46:19 EDT Ventricular Rate:  78 PR Interval:  162 QRS Duration: 70 QT Interval:  394 QTC Calculation: 449 R Axis:   1 Text Interpretation: Normal sinus rhythm Nonspecific T wave abnormality Abnormal ECG When compared with ECG of 28-Mar-2020 10:23, PREVIOUS ECG IS PRESENT when compared to prior, similar appearance. No STEMI Confirmed by Theda Belfast ([id number]) on 02/05/2022 1:16:15 PM  Radiology DG Chest 2 View  Result Date: 02/05/2022 CLINICAL DATA:  Chest pain EXAM: CHEST - 2 VIEW COMPARISON:  None Available. FINDINGS: The heart size and mediastinal contours are within normal limits. Both lungs are clear. The visualized skeletal structures are unremarkable. IMPRESSION: No active cardiopulmonary disease. Electronically Signed   By: Lorenza Cambridge M.D.   On: 02/05/2022 08:20    Procedures Procedures    Medications Ordered in ED Medications  fentaNYL (SUBLIMAZE) injection 50 mcg (50 mcg Intravenous Given 02/05/22 1405)  hydrochlorothiazide (HYDRODIURIL) tablet 25 mg (25 mg Oral Given 02/05/22 1404)  pantoprazole (PROTONIX) injection 40 mg (40 mg Intravenous Given 02/05/22 1408)    ED Course/ Medical Decision Making/ A&P                           Medical Decision Making Amount and/or Complexity  of Data Reviewed Labs: ordered. Radiology: ordered.  Risk Prescription drug management.   This patient presents to the ED for concern of chest pain, abdominal pain, nausea, vomiting, diarrhea fatigue, this involves a number of treatment options, and is a complaint that carries with it a moderate to high risk of complications and morbidity.  The differential diagnosis includes (for CP)  The emergent causes of chest pain include: Acute coronary syndrome, tamponade, pericarditis/myocarditis, aortic dissection, pulmonary embolism, tension pneumothorax, pneumonia, and esophageal rupture.  I do not believe the patient has an emergent cause of chest pain, other urgent/non-acute considerations include, but are not limited to: chronic angina, aortic stenosis, cardiomyopathy, mitral valve prolapse, pulmonary hypertension, aortic insufficiency, right ventricular hypertrophy, pleuritis, bronchitis, pneumothorax, tumor, gastroesophageal reflux disease (GERD), esophageal spasm, Mallory-Weiss syndrome, peptic ulcer disease, pancreatitis, functional gastrointestinal pain, cervical or thoracic disk disease or arthritis, shoulder arthritis, costochondritis, subacromial bursitis, anxiety or panic attack, herpes zoster, breast disorders, chest wall tumors, thoracic outlet syndrome, mediastinitis.    Co morbidities: Discussed in HPI   Brief History:  Patient is a 51 year old female to the emergency room today with complaints of nausea vomiting and diarrhea abdominal pain and chest pain for the past 3 days.  She states that she has not had any emesis today.  She states that she had more than 4 episodes of watery diarrhea 3 days ago but has had simply loose stool since.  She states she has some achy back pain as well.  She denies any difficulty breathing she denies any tearing or ripping quality to her chest pain.  She states that the pain has been much improved since she is arrived in the emergency room.  She has not  tried any medications prior to arrival other than aspirin which did not provide any relief.  She denies any nausea currently and states that she has not vomited today.  She denies any numbness or weakness in any extremity.  She denies any cough, hemoptysis.  She does state that she feels somewhat fatigued.  No other associate symptoms.    EMR reviewed including pt PMHx, past surgical history and past visits to ER.   See HPI for more details   Lab Tests:   I ordered and independently interpreted labs. Labs notable for Acute abnormal findings.  D-dimer undetectably low, lipase and LFTs within normal limits.  Troponin x2 within normal limits.  CBC unremarkable.  BMP without electrolyte dyscrasia or abnormal kidney function  Imaging Studies:  NAD. I personally reviewed all imaging studies and no acute abnormality found. I agree with radiology interpretation.    Cardiac Monitoring:  The patient was maintained on a cardiac monitor.  I personally viewed and interpreted the cardiac monitored which showed an underlying rhythm of: NSR EKG non-ischemic   Medicines ordered:  I ordered medication including Protonix, 50 mcg of fentanyl, HCTZ for pain, GI upset, hypertension Reevaluation of the patient after these medicines showed that the patient improved I have reviewed the patients home medicines and have made adjustments as needed   Critical Interventions:     Consults/Attending Physician   I discussed this case with my attending physician who cosigned this note including patient's presenting symptoms, physical exam, and planned diagnostics and interventions. Attending physician stated agreement with plan or made changes to plan which were implemented.   Reevaluation:  After the interventions noted above I re-evaluated patient and found that they have :improved   Social Determinants of Health:      Problem List / ED Course:  Chest pain now resolved.  I do not  believe that this patient's symptoms represents a PE or dissection.  Dimer within normal limits/undetectable.  She feels much improved after Protonix and a one-time 50 mcg dose of fentanyl.  Her chest pain is quite atypical.  She does have some back pain does not seem to quite radiate through her back but rather feel achy throughout.  Will provide with cardiology follow-up outpatient.  Strict return precautions given GI upset, nausea  vomiting diarrhea.  Recommend Zofran at home.   Dispostion:  After consideration of the diagnostic results and the patients response to treatment, I feel that the patent would benefit from close outpatient follow-up.  Strict return precautions.  Pepcid, HCTZ Bentyl Zofran.  It seems the patient has been prescribed HCTZ before but has not been taking it.  She agrees to begin taking this.   Final Clinical Impression(s) / ED Diagnoses Final diagnoses:  Generalized abdominal pain  Atypical chest pain    Rx / DC Orders ED Discharge Orders          Ordered    famotidine (PEPCID) 20 MG tablet  2 times daily        02/05/22 1511    hydrochlorothiazide (HYDRODIURIL) 25 MG tablet  Daily        02/05/22 1511    dicyclomine (BENTYL) 20 MG tablet  2 times daily        02/05/22 1512    ondansetron (ZOFRAN-ODT) 4 MG disintegrating tablet  Every 8 hours PRN        02/05/22 1512    Ambulatory referral to Cardiology       Comments: If you have not heard from the Cardiology office within the next 72 hours please call [phone].   02/05/22 1530              Gailen Shelter, [place] 02/05/22 1821    Tegeler, Canary Brim, MD 02/06/22 [phone]

## 2022-02-14 ENCOUNTER — Ambulatory Visit: Payer: No Typology Code available for payment source | Attending: Internal Medicine | Admitting: Internal Medicine

## 2022-04-09 ENCOUNTER — Other Ambulatory Visit: Payer: Self-pay | Admitting: Internal Medicine

## 2022-04-09 ENCOUNTER — Other Ambulatory Visit: Payer: Self-pay

## 2022-04-09 ENCOUNTER — Ambulatory Visit: Payer: Self-pay | Attending: Internal Medicine | Admitting: Internal Medicine

## 2022-04-09 DIAGNOSIS — I10 Essential (primary) hypertension: Secondary | ICD-10-CM

## 2022-04-29 ENCOUNTER — Other Ambulatory Visit: Payer: Self-pay

## 2022-07-24 ENCOUNTER — Emergency Department (HOSPITAL_COMMUNITY): Payer: No Typology Code available for payment source

## 2022-07-24 ENCOUNTER — Emergency Department (HOSPITAL_COMMUNITY)
Admission: EM | Admit: 2022-07-24 | Discharge: 2022-07-24 | Disposition: A | Discharge status: Home / Self Care | Payer: No Typology Code available for payment source | Attending: Emergency Medicine | Admitting: Emergency Medicine

## 2022-07-24 ENCOUNTER — Other Ambulatory Visit: Payer: Self-pay

## 2022-07-24 DIAGNOSIS — Z79899 Other long term (current) drug therapy: Secondary | ICD-10-CM | POA: Insufficient documentation

## 2022-07-24 DIAGNOSIS — S39012A Strain of muscle, fascia and tendon of lower back, initial encounter: Secondary | ICD-10-CM | POA: Insufficient documentation

## 2022-07-24 DIAGNOSIS — I10 Essential (primary) hypertension: Secondary | ICD-10-CM | POA: Insufficient documentation

## 2022-07-24 DIAGNOSIS — X58XXXA Exposure to other specified factors, initial encounter: Secondary | ICD-10-CM | POA: Insufficient documentation

## 2022-07-24 DIAGNOSIS — R002 Palpitations: Secondary | ICD-10-CM | POA: Insufficient documentation

## 2022-07-24 LAB — TROPONIN I (HIGH SENSITIVITY)
Troponin I (High Sensitivity): 3 ng/L (ref <18)
Troponin I (High Sensitivity): 3 ng/L (ref <18)

## 2022-07-24 LAB — BASIC METABOLIC PANEL
Anion gap: 8 (ref 5–15)
BUN: 16 mg/dL (ref 6–20)
CO2: 26 mmol/L (ref 22–32)
Calcium: 9 mg/dL (ref 8.9–10.3)
Chloride: 102 mmol/L (ref 98–111)
Creatinine, Ser: 0.86 mg/dL (ref 0.44–1.00)
GFR, Estimated: >60 mL/min (ref >60)
Glucose, Bld: 119 mg/dL — ABNORMAL HIGH (ref 70–99)
Potassium: 4.2 mmol/L (ref 3.5–5.1)
Sodium: 136 mmol/L (ref 135–145)

## 2022-07-24 LAB — CBC
HCT: 40 % (ref 36.0–46.0)
Hemoglobin: 13.2 g/dL (ref 12.0–15.0)
MCH: 29.5 pg (ref 26.0–34.0)
MCHC: 33 g/dL (ref 30.0–36.0)
MCV: 89.3 fL (ref 80.0–100.0)
Platelets: 283 10*3/uL (ref 150–400)
RBC: 4.48 MIL/uL (ref 3.87–5.11)
RDW: 12.4 % (ref 11.5–15.5)
WBC: 4.6 10*3/uL (ref 4.0–10.5)
nRBC: 0 % (ref 0.0–0.2)

## 2022-07-24 LAB — MAGNESIUM: Magnesium: 1.9 mg/dL (ref 1.7–2.4)

## 2022-07-24 MED ORDER — ACETAMINOPHEN 500 MG PO TABS
1000.0000 mg | ORAL_TABLET | Freq: Once | ORAL | Status: AC
  Administered 2022-07-24: 1000 mg via ORAL
  Filled 2022-07-24: qty 2

## 2022-07-24 MED ORDER — IBUPROFEN 200 MG PO TABS
600.0000 mg | ORAL_TABLET | Freq: Once | ORAL | Status: AC
  Administered 2022-07-24: 600 mg via ORAL
  Filled 2022-07-24: qty 3

## 2022-07-24 MED ORDER — LIDOCAINE 5 % EX PTCH
1.0000 | MEDICATED_PATCH | Freq: Once | CUTANEOUS | Status: DC
  Administered 2022-07-24: 1 via TRANSDERMAL
  Filled 2022-07-24: qty 1

## 2022-07-24 MED ORDER — LIDOCAINE 5 % EX PTCH: 1.0000 | MEDICATED_PATCH | CUTANEOUS | 0 refills | Status: AC

## 2022-07-24 NOTE — Discharge Instructions (Addendum)
You were seen in the emergency department for your back pain and your palpitations.  Your workup showed a normal heart rhythm, normal kidney function and normal electrolytes.  You likely pulled a muscle in your low back and you can take Tylenol and Motrin as needed for pain and both can be taken up to every 6 hours.  You can also use lidocaine patches, ice or heat.  You should try to stretch her back, just do not lift more than 10 pounds for the next several days to help your back heal.  You can follow-up with your primary doctor within the next week to have your symptoms rechecked.  You should return to the emergency department if your pains getting significantly worse and you are unable to walk, you have numbness or weakness in your legs, you are unable to urinate or if you have any other new or concerning symptoms.

## 2022-07-24 NOTE — ED Provider Triage Note (Addendum)
Emergency Medicine Provider Triage Evaluation Note  Janice Wang , Wang 52 y.o. female  was evaluated in triage.  Pt complains of palpitations onset this morning.  Has associated chest pain worse with inspiration.  Denies shortness of breath, abdominal pain, nausea, vomiting.  Denies past medical history of MI, stents anticoagulant use, recent surgery. Notes that she lifted heavy patient 3 days ago prior to the onset of her back pain.  She is Wang caregiver.  Denies recent fall or injury or trauma.  Review of Systems  Positive:  Negative:   Physical Exam  BP (!) 186/115   Pulse 86   Temp 99.1 F (37.3 C)   Resp 18   Ht '5\' 6"'$  (1.676 m)   Wt 77.1 kg   LMP 12/31/2015   SpO2 99%   BMI 27.44 kg/m  Gen:   Awake, no distress   Resp:  Normal effort  MSK:   Moves extremities without difficulty  Other:  No chest wall TTP . No spinal TTP.   Medical Decision Making  Medically screening exam initiated at 5:25 PM.  Appropriate orders placed.  Janice Wang Janice Wang was informed that the remainder of the evaluation will be completed by another provider, this initial triage assessment does not replace that evaluation, and the importance of remaining in the ED until their evaluation is complete.  Workup initiated   Janice Rylee A, PA-C 07/24/22 1726    Janice Sitton A, PA-C 07/24/22 1727

## 2022-07-24 NOTE — ED Triage Notes (Signed)
Pt arrived via POV. Pt states "my heart feels funny", and is c/o pain in lumbar area for 3x days.   AOx4

## 2022-07-24 NOTE — ED Provider Notes (Signed)
[place] EMERGENCY DEPARTMENT AT [facility] Provider Note   CSN: [id number] Arrival date & time: 07/24/22  1655     History  Chief Complaint  Patient presents with   Palpitations   Back Pain    Janice Wang is a 52 y.o. female.  Patient is a 52 year old female with a past medical history of hypertension presenting to the emergency department with back pain.  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  She states that the pain is radiating across her low back but denies any radiation down her legs.  She denies any numbness or weakness, saddle anesthesia, loss of bowel or bladder function.  She denies any dysuria or hematuria.  She states that she has not been taking anything for pain at home.  He did report palpitations to triage from this morning but denies this to me and is no longer having any palpitations at this time.  The history is provided by the patient.  Palpitations Associated symptoms: back pain   Back Pain      Home Medications Prior to Admission medications   Medication Sig Start Date End Date Taking? Authorizing Provider  lidocaine (LIDODERM) 5 % Place 1 patch onto the skin daily. Remove & Discard patch within 12 hours or as directed by MD 07/24/22  Yes Maylon Peppers, Norman Herrlich, DO  dicyclomine (BENTYL) 20 MG tablet Take 1 tablet (20 mg total) by mouth 2 (two) times daily. 02/05/22   Tedd Sias, PA  famotidine (PEPCID) 20 MG tablet Take 1 tablet (20 mg total) by mouth 2 (two) times daily. 02/05/22   Tedd Sias, PA  hydrochlorothiazide (HYDRODIURIL) 25 MG tablet TAKE 1 TABLET (25 MG TOTAL) BY MOUTH DAILY. 02/05/22 02/05/23  Tedd Sias, PA  ibuprofen (ADVIL) 600 MG tablet Take 1 tablet (600 mg total) by mouth 3 (three) times daily. 10/01/21   Nyoka Lint, PA-C  methocarbamol (ROBAXIN) 500 MG tablet Take 2 tablets (1,000 mg total) by mouth 4 (four) times daily as  needed (Pain). Patient not taking: Reported on 03/28/2020 01/20/16   Pisciotta, Elmyra Ricks, PA-C  ondansetron (ZOFRAN-ODT) 4 MG disintegrating tablet Take 1 tablet (4 mg total) by mouth every 8 (eight) hours as needed for nausea or vomiting. 02/05/22   Tedd Sias, PA      Allergies    Patient has no known allergies.    Review of Systems   Review of Systems  Cardiovascular:  Positive for palpitations.  Musculoskeletal:  Positive for back pain.    Physical Exam Updated Vital Signs BP 102/86   Pulse 85   Temp 99.1 F (37.3 C)   Resp (!) 23   Ht '5\' 6"'$  (1.676 m)   Wt 77.1 kg   LMP 12/31/2015   SpO2 100%   BMI 27.44 kg/m  Physical Exam Vitals and nursing note reviewed.  Constitutional:      General: She is not in acute distress.    Appearance: Normal appearance.  HENT:     Head: Normocephalic and atraumatic.     Nose: Nose normal.     Mouth/Throat:     Mouth: Mucous membranes are moist.     Pharynx: Oropharynx is clear.  Eyes:     Extraocular Movements: Extraocular movements intact.     Conjunctiva/sclera: Conjunctivae normal.  Neck:     Comments: No midline neck tenderness Cardiovascular:  Rate and Rhythm: Normal rate and regular rhythm.     Heart sounds: Normal heart sounds.  Pulmonary:     Effort: Pulmonary effort is normal.     Breath sounds: Normal breath sounds.  Abdominal:     General: Abdomen is flat.     Palpations: Abdomen is soft.     Tenderness: There is no abdominal tenderness.  Musculoskeletal:        General: Normal range of motion.     Cervical back: Normal range of motion and neck supple.     Right lower leg: No edema.     Left lower leg: No edema.     Comments: No midline back tenderness Negative straight leg raise bilaterally  Skin:    General: Skin is warm and dry.  Neurological:     General: No focal deficit present.     Mental Status: She is alert and oriented to person, place, and time.     Sensory: No sensory deficit.     Motor: No  weakness.  Psychiatric:        Mood and Affect: Mood normal.        Behavior: Behavior normal.     ED Results / Procedures / Treatments   Labs (all labs ordered are listed, but only abnormal results are displayed) Labs Reviewed  BASIC METABOLIC PANEL - Abnormal; Notable for the following components:      Result Value   Glucose, Bld 119 (*)    All other components within normal limits  CBC  MAGNESIUM  TROPONIN I (HIGH SENSITIVITY)  TROPONIN I (HIGH SENSITIVITY)    EKG EKG Interpretation  Date/Time:  Wednesday July 24 2022 17:16:30 EDT Ventricular Rate:  83 PR Interval:  155 QRS Duration: 82 QT Interval:  365 QTC Calculation: 429 R Axis:   -4 Text Interpretation: Sinus rhythm Probable left atrial enlargement Low voltage, precordial leads Probable left ventricular hypertrophy Borderline T abnormalities, diffuse leads No significant change since last tracing Confirmed by Leanord Asal ([id number]) on 07/24/2022 9:27:53 PM  Radiology DG Chest Port 1 View  Result Date: 07/24/2022 CLINICAL DATA:  Chest pain. EXAM: PORTABLE CHEST 1 VIEW COMPARISON:  02/05/2022. FINDINGS: Clear lungs. Normal heart size and mediastinal contours. No pleural effusion or pneumothorax. Visualized bones and upper abdomen are unremarkable. IMPRESSION: No evidence of acute cardiopulmonary disease. Electronically Signed   By: Emmit Alexanders M.D.   On: 07/24/2022 17:51    Procedures Procedures    Medications Ordered in ED Medications  acetaminophen (TYLENOL) tablet 1,000 mg (has no administration in time range)  ibuprofen (ADVIL) tablet 600 mg (has no administration in time range)  lidocaine (LIDODERM) 5 % 1-3 patch (has no administration in time range)    ED Course/ Medical Decision Making/ A&P                             Medical Decision Making This patient presents to the ED with chief complaint(s) of back pain, palpitations with pertinent past medical history of HTN which further complicates the  presenting complaint. The complaint involves an extensive differential diagnosis and also carries with it a high risk of complications and morbidity.    The differential diagnosis includes muscle strain or spasm, no midline back tenderness and no significant trauma making fracture unlikely, no focal neurologic deficits, saddle anesthesia or loss of bowel or bladder function making cauda equina unlikely, no fevers, IVDU or vital procedures making epidural abscess unlikely,  patient has no urinary symptoms making pyelonephritis or nephrolithiasis unlikely, considering arrhythmia, anemia, electrolyte abnormality as potential causes of the palpitations  Additional history obtained: Additional history obtained from N/A Records reviewed Primary Care Documents  ED Course and Reassessment: Patient was initially evaluated by provider in triage and had EKG, labs and chest x-ray performed.  Patient's workup is within normal range without acute abnormalities.  Patient does have bilateral lumbar paraspinal muscle tenderness consistent with muscle strain.  She has no neurologic deficits and negative straight leg raise bilaterally making sciatica unlikely.  She will be treated with Tylenol, Motrin and lidocaine patch.  She was recommended to avoid heavy lifting for the next several days and recommended primary care follow-up.  Independent labs interpretation:  The following labs were independently interpreted: within normal range  Independent visualization of imaging: - I independently visualized the following imaging with scope of interpretation limited to determining acute life threatening conditions related to emergency care: CXR, which revealed no acute disease  Consultation: - Consulted or discussed management/test interpretation w/ external professional: N/A  Consideration for admission or further workup: Patient has no emergent conditions requiring admission or further work-up at this time and is stable  for discharge home with primary care follow-up  Social Determinants of health: N/A    Risk OTC drugs. Prescription drug management.          Final Clinical Impression(s) / ED Diagnoses Final diagnoses:  Strain of lumbar region, initial encounter  Palpitations    Rx / DC Orders ED Discharge Orders          Ordered    lidocaine (LIDODERM) 5 %  Every 24 hours        07/24/22 2218              Kemper Durie, DO 07/24/22 2219

## 2024-01-07 ENCOUNTER — Encounter (HOSPITAL_BASED_OUTPATIENT_CLINIC_OR_DEPARTMENT_OTHER): Payer: Self-pay | Admitting: Emergency Medicine

## 2024-01-07 ENCOUNTER — Emergency Department (HOSPITAL_BASED_OUTPATIENT_CLINIC_OR_DEPARTMENT_OTHER)
Admission: EM | Admit: 2024-01-07 | Discharge: 2024-01-07 | Disposition: A | Discharge status: Home / Self Care | Attending: Emergency Medicine | Admitting: Emergency Medicine

## 2024-01-07 ENCOUNTER — Other Ambulatory Visit: Payer: Self-pay

## 2024-01-07 ENCOUNTER — Emergency Department (HOSPITAL_BASED_OUTPATIENT_CLINIC_OR_DEPARTMENT_OTHER)

## 2024-01-07 DIAGNOSIS — X500XXA Overexertion from strenuous movement or load, initial encounter: Secondary | ICD-10-CM | POA: Insufficient documentation

## 2024-01-07 DIAGNOSIS — M25572 Pain in left ankle and joints of left foot: Secondary | ICD-10-CM | POA: Insufficient documentation

## 2024-01-07 MED ORDER — IBUPROFEN 400 MG PO TABS
600.0000 mg | ORAL_TABLET | Freq: Once | ORAL | Status: AC
  Administered 2024-01-07: 600 mg via ORAL
  Filled 2024-01-07: qty 1

## 2024-01-07 NOTE — ED Triage Notes (Signed)
 Pt in with L inner ankle pain, began 2 days ago and worsened today. Denies any known injuries, arrives ambulatory with use of cane for support.

## 2024-01-07 NOTE — Discharge Instructions (Addendum)
 Evaluation today revealed that he may have a mild ankle sprain.  You can use the lace up brace for comfort and during the day.  Would remove it at night.  As we discussed recommend applying ice 3-4 times a day and you can take ibuprofen  and Tylenol  for pain relief.  Please follow-up with your PCP.

## 2024-01-07 NOTE — ED Provider Notes (Signed)
 [place] EMERGENCY DEPARTMENT AT [facility] Provider Note   CSN: [id number] Arrival date & time: 01/07/24  2007     Patient presents with: Ankle Pain  HPI Janice Wang is a 53 y.o. female presenting for left ankle pain.  Started 2 days ago.  She states she may have twisted her ankle 2 days ago but cannot remember.  Pain is about the lateral aspect of the ankle.  Denies any notable swelling or bruising.  She states it just hurts intermittently when she walks.    Ankle Pain      Prior to Admission medications   Medication Sig Start Date End Date Taking? Authorizing Provider  dicyclomine  (BENTYL ) 20 MG tablet Take 1 tablet (20 mg total) by mouth 2 (two) times daily. 02/05/22   Neldon Hamp RAMAN, PA  famotidine  (PEPCID ) 20 MG tablet Take 1 tablet (20 mg total) by mouth 2 (two) times daily. 02/05/22   Neldon Hamp RAMAN, PA  hydrochlorothiazide  (HYDRODIURIL ) 25 MG tablet TAKE 1 TABLET (25 MG TOTAL) BY MOUTH DAILY. 02/05/22 02/05/23  Neldon Hamp RAMAN, PA  ibuprofen  (ADVIL ) 600 MG tablet Take 1 tablet (600 mg total) by mouth 3 (three) times daily. 10/01/21   Lynwood Lenis, PA-C  lidocaine  (LIDODERM ) 5 % Place 1 patch onto the skin daily. Remove & Discard patch within 12 hours or as directed by MD 07/24/22   Kingsley, Victoria K, DO  methocarbamol  (ROBAXIN ) 500 MG tablet Take 2 tablets (1,000 mg total) by mouth 4 (four) times daily as needed (Pain). Patient not taking: Reported on 03/28/2020 01/20/16   Pisciotta, Nat, PA-C  ondansetron  (ZOFRAN -ODT) 4 MG disintegrating tablet Take 1 tablet (4 mg total) by mouth every 8 (eight) hours as needed for nausea or vomiting. 02/05/22   Neldon Hamp RAMAN, PA    Allergies: Patient has no known allergies.    Review of Systems See HPI  Updated Vital Signs BP (!) 165/90   Pulse 80   Temp 98 F (36.7 C) (Oral)   Resp 18   Wt 77.1 kg   LMP 12/31/2015   SpO2 99%   BMI 27.43 kg/m   Physical Exam Constitutional:      Appearance: Normal  appearance.  HENT:     Head: Normocephalic.     Nose: Nose normal.  Eyes:     Conjunctiva/sclera: Conjunctivae normal.  Pulmonary:     Effort: Pulmonary effort is normal.  Musculoskeletal:     Right ankle: Normal pulse.     Left ankle: No swelling, deformity or ecchymosis. Tenderness present over the lateral malleolus. Normal range of motion. Normal pulse.     Left Achilles Tendon: Normal. Thompson's test negative.  Neurological:     Mental Status: She is alert.  Psychiatric:        Mood and Affect: Mood normal.     (all labs ordered are listed, but only abnormal results are displayed) Labs Reviewed - No data to display  EKG: None  Radiology: DG Ankle Complete Left Result Date: 01/07/2024 CLINICAL DATA:  ankle pain EXAM: LEFT ANKLE COMPLETE - 3+ VIEW COMPARISON:  None Available. FINDINGS: There is no evidence of fracture, dislocation, or joint effusion. There is no evidence of arthropathy or other focal bone abnormality. Soft tissues are unremarkable. IMPRESSION: Negative. Electronically Signed   By: Morgane  Naveau M.D.   On: 01/07/2024 20:34     Procedures   Medications Ordered in the ED  ibuprofen  (ADVIL ) tablet 600 mg (has no administration in time range)  Medical Decision Making Amount and/or Complexity of Data Reviewed Radiology: ordered.   53 year old well-appearing female presenting for left ankle pain.  Exam notable for tenderness about the lateral malleolus but no notable swelling bruising or obvious deformity.  Neurovascularly intact.  Is possible could be a very mild ankle sprain.  X-ray was negative for acute osseous abnormality.  Placed lace up ankle brace for comfort.  Advised RICE treatment at home with NSAIDs and follow-up with PCP.  Discharged.      Final diagnoses:  Left ankle pain, unspecified chronicity    ED Discharge Orders     None          Hendel Gatliff K, PA-C 01/07/24 2131    Dreama Longs, MD 01/08/24 2134

## 2024-02-09 ENCOUNTER — Encounter (HOSPITAL_BASED_OUTPATIENT_CLINIC_OR_DEPARTMENT_OTHER): Payer: Self-pay | Admitting: Urology

## 2024-02-09 ENCOUNTER — Emergency Department (HOSPITAL_BASED_OUTPATIENT_CLINIC_OR_DEPARTMENT_OTHER): Admitting: Radiology

## 2024-02-09 ENCOUNTER — Other Ambulatory Visit: Payer: Self-pay

## 2024-02-09 ENCOUNTER — Emergency Department (HOSPITAL_BASED_OUTPATIENT_CLINIC_OR_DEPARTMENT_OTHER)

## 2024-02-09 ENCOUNTER — Emergency Department (HOSPITAL_BASED_OUTPATIENT_CLINIC_OR_DEPARTMENT_OTHER)
Admission: EM | Admit: 2024-02-09 | Discharge: 2024-02-09 | Disposition: A | Discharge status: Home / Self Care | Attending: Emergency Medicine | Admitting: Emergency Medicine

## 2024-02-09 DIAGNOSIS — R519 Headache, unspecified: Secondary | ICD-10-CM | POA: Insufficient documentation

## 2024-02-09 DIAGNOSIS — W19XXXA Unspecified fall, initial encounter: Secondary | ICD-10-CM

## 2024-02-09 DIAGNOSIS — M542 Cervicalgia: Secondary | ICD-10-CM | POA: Insufficient documentation

## 2024-02-09 DIAGNOSIS — M79604 Pain in right leg: Secondary | ICD-10-CM | POA: Insufficient documentation

## 2024-02-09 DIAGNOSIS — Y990 Civilian activity done for income or pay: Secondary | ICD-10-CM | POA: Insufficient documentation

## 2024-02-09 DIAGNOSIS — W010XXA Fall on same level from slipping, tripping and stumbling without subsequent striking against object, initial encounter: Secondary | ICD-10-CM | POA: Insufficient documentation

## 2024-02-09 DIAGNOSIS — M25521 Pain in right elbow: Secondary | ICD-10-CM | POA: Insufficient documentation

## 2024-02-09 DIAGNOSIS — M25511 Pain in right shoulder: Secondary | ICD-10-CM | POA: Insufficient documentation

## 2024-02-09 MED ORDER — IBUPROFEN 400 MG PO TABS
600.0000 mg | ORAL_TABLET | Freq: Once | ORAL | Status: AC
  Administered 2024-02-09: 600 mg via ORAL
  Filled 2024-02-09: qty 1

## 2024-02-09 NOTE — ED Notes (Signed)
 Pt given discharge instructions. Opportunities given for questions. Pt verbalizes understanding. Bethena Powell SAUNDERS, RN

## 2024-02-09 NOTE — Discharge Instructions (Addendum)
 Evaluation for your fall today was reassuring.  As we discussed apply ice in areas that hurt 3-4 times a day for the next 4 days and you can also take ibuprofen  and Tylenol  for pain relief.  Please follow-up your PCP.  If symptoms worsen please return to the ED for further evaluation.

## 2024-02-09 NOTE — ED Provider Notes (Signed)
 [place] EMERGENCY DEPARTMENT AT [facility] Provider Note   CSN: [id number] Arrival date & time: 02/09/24  1227     Patient presents with: Fall  HPI Janice Wang is a 53 y.o. female presenting for fall. Occurred Saturday. Fell from standing at work landing on right side of her body.  States she tripped over something.  Now endorsing right sided neck pain, right elbow and shoulder pain and right leg pain. Ambulatory. Denies use of blood thinner.  Patient also requesting a work note.    Fall       Prior to Admission medications   Medication Sig Start Date End Date Taking? Authorizing Provider  dicyclomine  (BENTYL ) 20 MG tablet Take 1 tablet (20 mg total) by mouth 2 (two) times daily. 02/05/22   Neldon Hamp RAMAN, PA  famotidine  (PEPCID ) 20 MG tablet Take 1 tablet (20 mg total) by mouth 2 (two) times daily. 02/05/22   Neldon Hamp RAMAN, PA  hydrochlorothiazide  (HYDRODIURIL ) 25 MG tablet TAKE 1 TABLET (25 MG TOTAL) BY MOUTH DAILY. 02/05/22 02/05/23  Neldon Hamp RAMAN, PA  ibuprofen  (ADVIL ) 600 MG tablet Take 1 tablet (600 mg total) by mouth 3 (three) times daily. 10/01/21   Lynwood Lenis, PA-C  lidocaine  (LIDODERM ) 5 % Place 1 patch onto the skin daily. Remove & Discard patch within 12 hours or as directed by MD 07/24/22   Kingsley, Victoria K, DO  methocarbamol  (ROBAXIN ) 500 MG tablet Take 2 tablets (1,000 mg total) by mouth 4 (four) times daily as needed (Pain). Patient not taking: Reported on 03/28/2020 01/20/16   Pisciotta, Nat, PA-C  ondansetron  (ZOFRAN -ODT) 4 MG disintegrating tablet Take 1 tablet (4 mg total) by mouth every 8 (eight) hours as needed for nausea or vomiting. 02/05/22   Neldon Hamp RAMAN, PA    Allergies: Patient has no known allergies.    Review of Systems See HPI  Updated Vital Signs BP (!) 154/114   Pulse 74   Temp 98.3 F (36.8 C)   Resp 16   Ht 5' 6 (1.676 m)   Wt 77.1 kg   LMP 12/31/2015   SpO2 98%   BMI 27.43 kg/m   Physical  Exam  (all labs ordered are listed, but only abnormal results are displayed) Labs Reviewed - No data to display  EKG: None  Radiology: DG Ankle Complete Right Result Date: 02/09/2024 CLINICAL DATA:  Status post fall. EXAM: RIGHT ANKLE - COMPLETE 3+ VIEW COMPARISON:  None Available. FINDINGS: There is no evidence of fracture, dislocation, or joint effusion. There is no evidence of arthropathy or other focal bone abnormality. Mild to moderate severity diffuse soft tissue swelling is seen. IMPRESSION: Mild to moderate severity diffuse soft tissue swelling without evidence of acute fracture or dislocation. Electronically Signed   By: Suzen Dials M.D.   On: 02/09/2024 14:57   DG Knee Complete 4 Views Right Result Date: 02/09/2024 CLINICAL DATA:  Status post fall. EXAM: RIGHT KNEE - COMPLETE 4+ VIEW COMPARISON:  None Available. FINDINGS: No evidence of an acute fracture, dislocation, or joint effusion. No evidence of arthropathy or other focal bone abnormality. Soft tissues are unremarkable. IMPRESSION: Negative. Electronically Signed   By: Suzen Dials M.D.   On: 02/09/2024 14:53   DG Hand Complete Right Result Date: 02/09/2024 CLINICAL DATA:  Status post fall. EXAM: RIGHT HAND - COMPLETE 3+ VIEW COMPARISON:  None Available. FINDINGS: There is no evidence of fracture or dislocation. There is no evidence of significant arthropathy or other focal bone  abnormality. Mild dorsal soft tissue swelling is noted. IMPRESSION: Mild dorsal soft tissue swelling without evidence of acute fracture or dislocation. Electronically Signed   By: Suzen Dials M.D.   On: 02/09/2024 14:52   DG Hip Unilat With Pelvis 2-3 Views Right Result Date: 02/09/2024 CLINICAL DATA:  Status post fall. EXAM: DG HIP (WITH OR WITHOUT PELVIS) 2-3V RIGHT COMPARISON:  None Available. FINDINGS: There is no evidence of hip fracture or dislocation. There is no evidence of arthropathy or other focal bone abnormality. IMPRESSION:  Negative. Electronically Signed   By: Suzen Dials M.D.   On: 02/09/2024 14:46   DG Shoulder Right Result Date: 02/09/2024 CLINICAL DATA:  Status post fall. EXAM: RIGHT SHOULDER - 2+ VIEW COMPARISON:  None Available. FINDINGS: There is no evidence of fracture or dislocation. There is no evidence of arthropathy or other focal bone abnormality. Soft tissues are unremarkable. IMPRESSION: Negative. Electronically Signed   By: Suzen Dials M.D.   On: 02/09/2024 14:45   DG Elbow Complete Right Result Date: 02/09/2024 CLINICAL DATA:  Status post fall. EXAM: RIGHT ELBOW - COMPLETE 3+ VIEW COMPARISON:  None Available. FINDINGS: There is no evidence of an acute fracture, dislocation, or joint effusion. Mild chronic and degenerative changes are seen involving the right lateral humeral epicondyle and right coronoid process. Soft tissues are unremarkable. IMPRESSION: Mild chronic and degenerative changes without evidence of acute fracture. Electronically Signed   By: Suzen Dials M.D.   On: 02/09/2024 14:44   CT Head Wo Contrast Result Date: 02/09/2024 CLINICAL DATA:  Provided history: Polytrauma, blunt. Additional history provided: Mechanical fall (ground level). EXAM: CT HEAD WITHOUT CONTRAST TECHNIQUE: Contiguous axial images were obtained from the base of the skull through the vertex without intravenous contrast. RADIATION DOSE REDUCTION: This exam was performed according to the departmental dose-optimization program which includes automated exposure control, adjustment of the mA and/or kV according to patient size and/or use of iterative reconstruction technique. COMPARISON:  None. FINDINGS: Brain: There is no acute intracranial hemorrhage. No demarcated cortical infarct. No extra-axial fluid collection. No evidence of an intracranial mass. No midline shift. Vascular: No hyperdense vessel. Skull: No calvarial fracture or aggressive osseous lesion. Sinuses/Orbits: No mass or acute finding within the  imaged orbits. No significant paranasal sinus disease at the imaged levels. IMPRESSION: No evidence of an acute intracranial abnormality. Electronically Signed   By: Rockey Childs D.O.   On: 02/09/2024 14:15   CT Cervical Spine Wo Contrast Result Date: 02/09/2024 CLINICAL DATA:  Polytrauma, blunt Patient reports falling 2 days ago. EXAM: CT CERVICAL SPINE WITHOUT CONTRAST TECHNIQUE: Multidetector CT imaging of the cervical spine was performed without intravenous contrast. Multiplanar CT image reconstructions were also generated. RADIATION DOSE REDUCTION: This exam was performed according to the departmental dose-optimization program which includes automated exposure control, adjustment of the mA and/or kV according to patient size and/or use of iterative reconstruction technique. COMPARISON:  CT cervical spine 01/19/2016 FINDINGS: Alignment: Reversal of usual cervical lordosis, similar to previous study. No focal angulation or listhesis. Skull base and vertebrae: No evidence of acute cervical spine fracture or traumatic subluxation. Soft tissues and spinal canal: No prevertebral fluid or swelling. No visible canal hematoma. Disc levels: Mildly progressive spondylosis with disc space narrowing and uncinate spurring at C5-6 and C6-7. No resulting high-grade spinal stenosis or foraminal narrowing. Upper chest: Clear lung apices. Other: None. IMPRESSION: 1. No evidence of acute cervical spine fracture, traumatic subluxation or static signs of instability. 2. Mildly progressive chronic spondylosis at  C5-6 and C6-7. Electronically Signed   By: Elsie Perone M.D.   On: 02/09/2024 13:52     Procedures   Medications Ordered in the ED  ibuprofen  (ADVIL ) tablet 600 mg (has no administration in time range)                                    Medical Decision Making Amount and/or Complexity of Data Reviewed Radiology: ordered.   53 year old well-appearing female presenting for mechanical fall.  Exam was  unremarkable.  CT scans and x-rays without acute findings.  Suspect these are soft tissue injuries.  Advised supportive treatment at home with NSAIDs and follow-up with her PCP.  Provided a work note.  Discussed return precautions.  Discharged.      Final diagnoses:  Fall, initial encounter    ED Discharge Orders     None          Lang Norleen POUR, PA-C 02/09/24 1631    Neysa Caron PARAS, DO 02/09/24 2356

## 2024-02-09 NOTE — ED Triage Notes (Signed)
 Pt states mechanical fall form ground level Saturday night while at job  Using cane for ambulation, states right side pain from neck to the knee     Provider at bedside for [facility]

## 2024-02-09 NOTE — ED Provider Triage Note (Addendum)
 Emergency Medicine Provider Triage Evaluation Note  Jaiah Weigel , a 53 y.o. female  was evaluated in triage.  Pt complains of fall. Occurred Saturday. Fell from standing at work landing on right side of her body. Now endorsing right sided neck pain, right elbow and shoulder pain and right leg pain. Ambulatory. Denies use of blood thinner.  Review of Systems  Positive: See above Negative: See above  Physical Exam  BP (!) 154/114   Pulse 74   Temp 98.3 F (36.8 C)   Resp 16   Ht 5' 6 (1.676 m)   Wt 77.1 kg   LMP 12/31/2015   SpO2 98%   BMI 27.43 kg/m  Gen:   Awake, no distress   Resp:  Normal effort  MSK:   Moves extremities without difficulty  Other:    Medical Decision Making  Medically screening exam initiated at 1:15 PM.  Appropriate orders placed.  [facility] Brown was informed that the remainder of the evaluation will be completed by another provider, this initial triage assessment does not replace that evaluation, and the importance of remaining in the ED until their evaluation is complete.  Work up started   Lang Norleen POUR, PA-C 02/09/24 1316    Lang Norleen POUR, PA-C 02/09/24 1316

## 2024-08-02 ENCOUNTER — Ambulatory Visit (HOSPITAL_BASED_OUTPATIENT_CLINIC_OR_DEPARTMENT_OTHER): Admitting: Family Medicine
